# Patient Record
Sex: Male | Born: 1975 | Race: White | Hispanic: No | Marital: Married | Smoking: Never smoker
Health system: Southern US, Community
[De-identification: ages and names within clinical notes are randomized; demographics above are authoritative.]

## PROBLEM LIST (undated history)

## (undated) DIAGNOSIS — E079 Disorder of thyroid, unspecified: Secondary | ICD-10-CM

## (undated) DIAGNOSIS — R51 Headache: Secondary | ICD-10-CM

## (undated) DIAGNOSIS — G43909 Migraine, unspecified, not intractable, without status migrainosus: Secondary | ICD-10-CM

## (undated) DIAGNOSIS — R519 Headache, unspecified: Secondary | ICD-10-CM

---

## 2009-07-08 ENCOUNTER — Ambulatory Visit: Payer: Self-pay | Admitting: Internal Medicine

## 2011-08-04 IMAGING — CR RIGHT ANKLE - COMPLETE 3+ VIEW
1 series · 5 of 5 positions shown · non-contrast
Comparison: none

REASON FOR EXAM: trauma
COMMENTS:

PROCEDURE:     MDR - MDR ANKLE RIGHT COMPLETE  - July 08, 2009  [DATE]
RESULT:     Five views of the right ankle reveal the joint mortise to be
preserved. The talar dome is intact. I do not see evidence of fracture the
metatarsal bases. No malleolar fracture is identified.

[Series 1: view not recorded · 0.17mm/px · 5 of 5 slices shown]
[im 1/5]
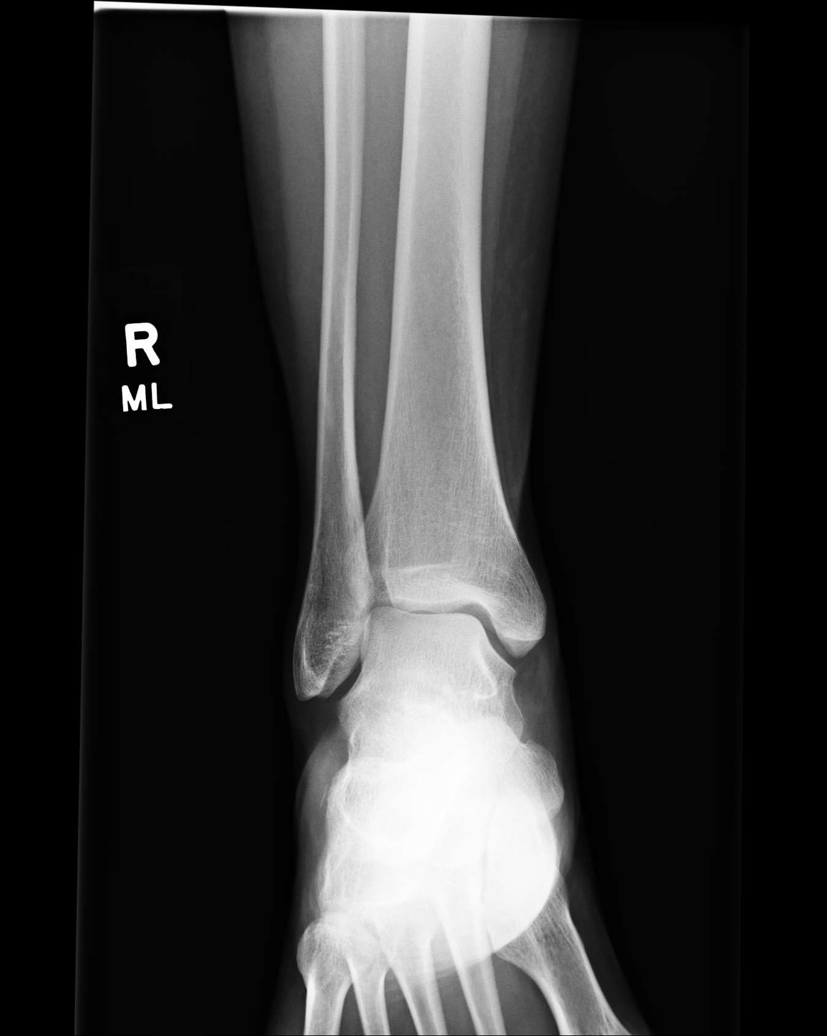
[im 2/5]
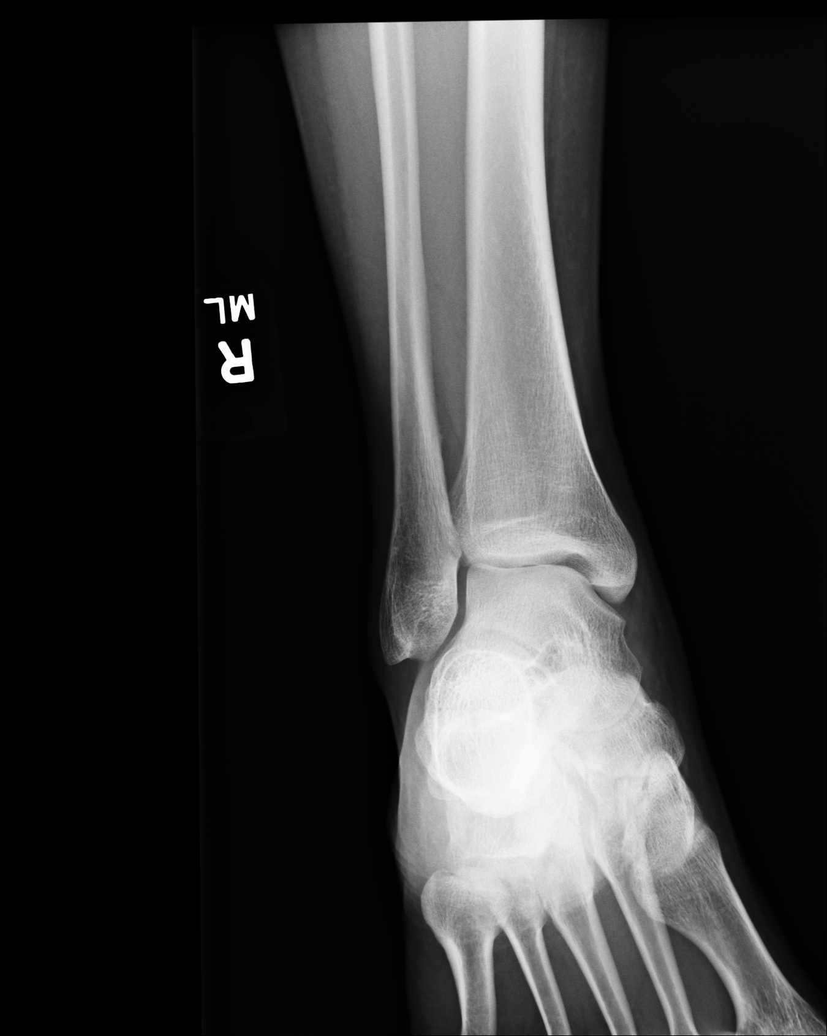
[im 3/5]
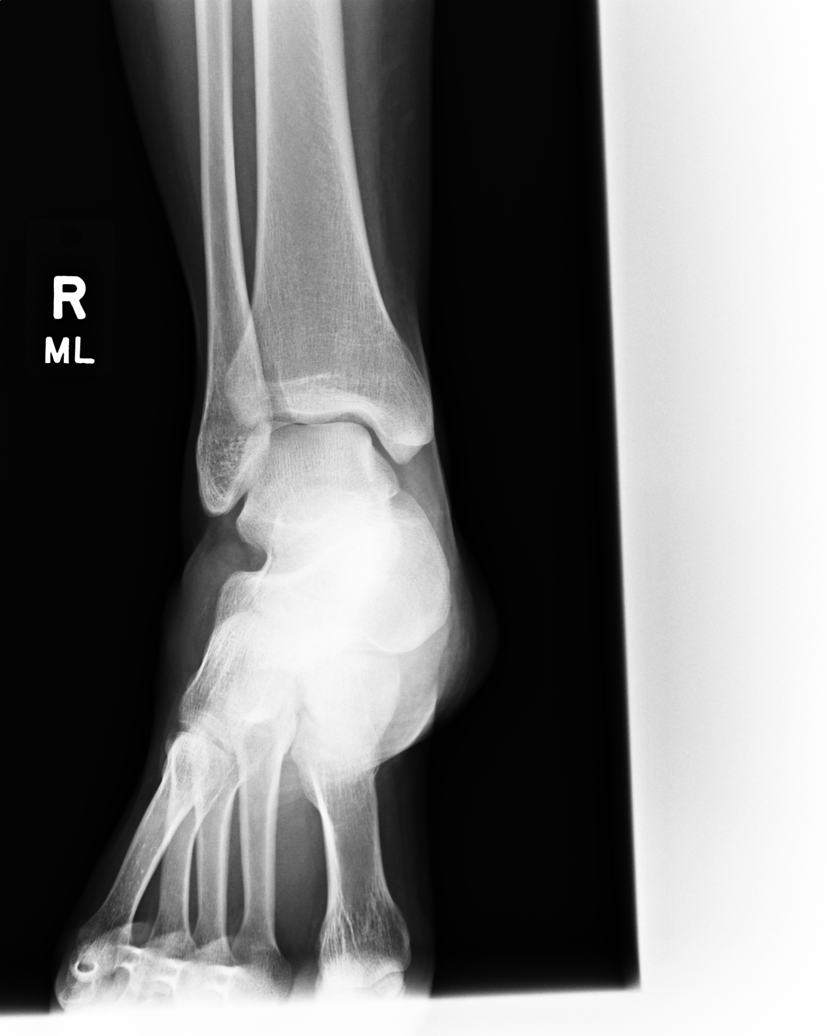
[im 4/5]
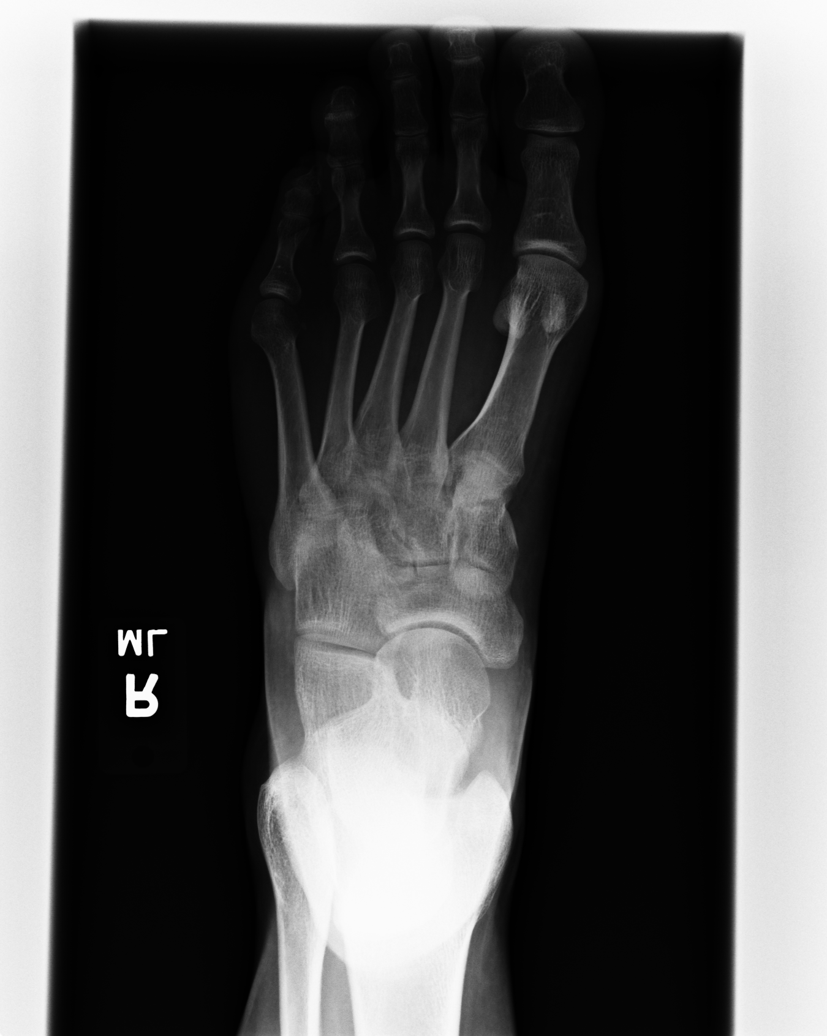
[im 5/5]
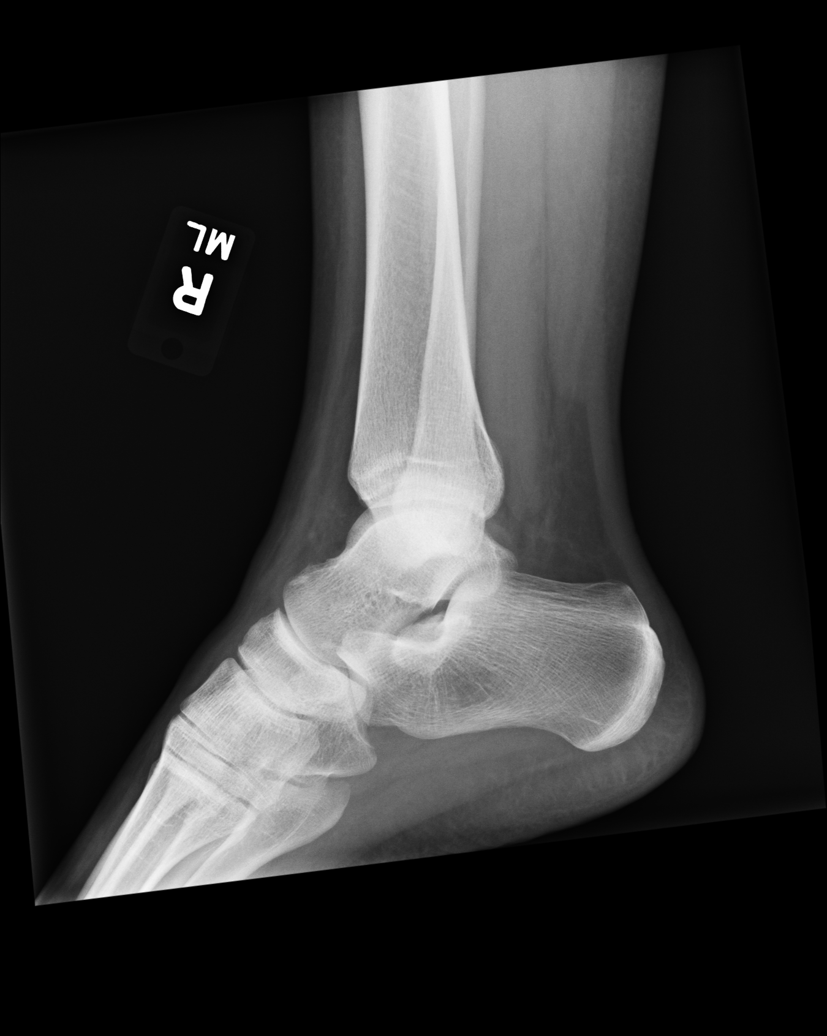

[5 of 5 positions shown; findings below may reference images not displayed]

IMPRESSION: I do not see evidence of an acute fracture of the ankle. If
there are symptoms referable to the foot, a followup foot series is
available upon request.

## 2014-02-22 ENCOUNTER — Ambulatory Visit: Payer: Self-pay

## 2014-06-24 ENCOUNTER — Ambulatory Visit: Payer: BLUE CROSS/BLUE SHIELD

## 2014-06-24 ENCOUNTER — Encounter: Payer: Self-pay | Admitting: Gynecology

## 2014-06-24 ENCOUNTER — Ambulatory Visit
Admission: EM | Admit: 2014-06-24 | Discharge: 2014-06-24 | Disposition: A | Discharge status: Home / Self Care | Payer: BLUE CROSS/BLUE SHIELD | Attending: Family Medicine | Admitting: Family Medicine

## 2014-06-24 DIAGNOSIS — M20019 Mallet finger of unspecified finger(s): Secondary | ICD-10-CM

## 2014-06-24 DIAGNOSIS — S62616A Displaced fracture of proximal phalanx of right little finger, initial encounter for closed fracture: Secondary | ICD-10-CM | POA: Diagnosis not present

## 2014-06-24 DIAGNOSIS — S62639A Displaced fracture of distal phalanx of unspecified finger, initial encounter for closed fracture: Secondary | ICD-10-CM

## 2014-06-24 DIAGNOSIS — S62606A Fracture of unspecified phalanx of right little finger, initial encounter for closed fracture: Secondary | ICD-10-CM

## 2014-06-24 DIAGNOSIS — E079 Disorder of thyroid, unspecified: Secondary | ICD-10-CM | POA: Diagnosis not present

## 2014-06-24 DIAGNOSIS — M79641 Pain in right hand: Secondary | ICD-10-CM | POA: Diagnosis present

## 2014-06-24 HISTORY — DX: Migraine, unspecified, not intractable, without status migrainosus: G43.909

## 2014-06-24 HISTORY — DX: Disorder of thyroid, unspecified: E07.9

## 2014-06-24 HISTORY — DX: Headache, unspecified: R51.9

## 2014-06-24 HISTORY — DX: Headache: R51

## 2014-06-24 NOTE — ED Notes (Signed)
Patient also c/o of left side soarness
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
## 2014-06-27 ENCOUNTER — Encounter: Payer: Self-pay | Admitting: Physician Assistant
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
